# Patient Record
Sex: Male | Born: 1945 | Race: White | Hispanic: No | Marital: Married
Health system: Midwestern US, Academic
[De-identification: ages and names within clinical notes are randomized; demographics above are authoritative.]

---

## 2016-10-28 IMAGING — US DUPLEX LOW EXTREM ARTERY BIL
1 series · 14 of 25 positions shown · non-contrast
Comparison: none

[Series 1: duplex low extrem artery bil · 14 of 65 slices shown]
[im 1/65]
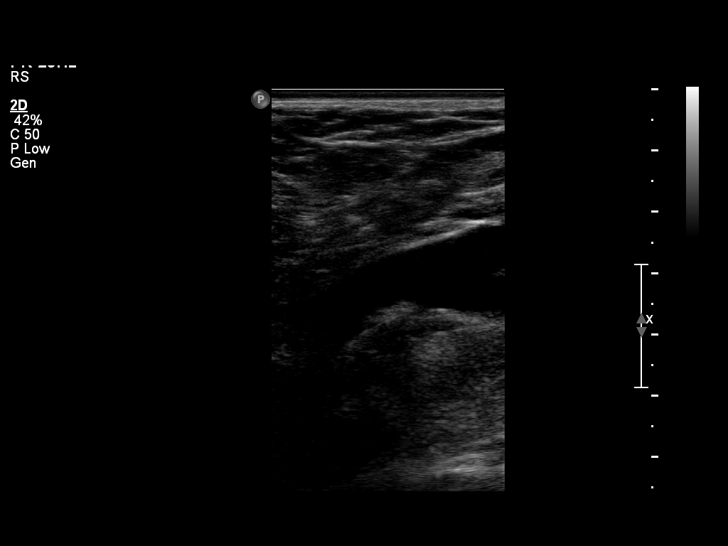
[im 6/65]
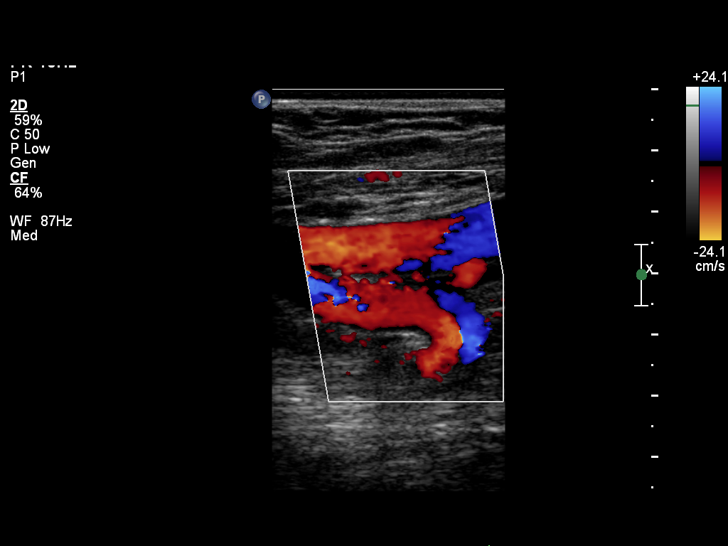
[im 11/65]
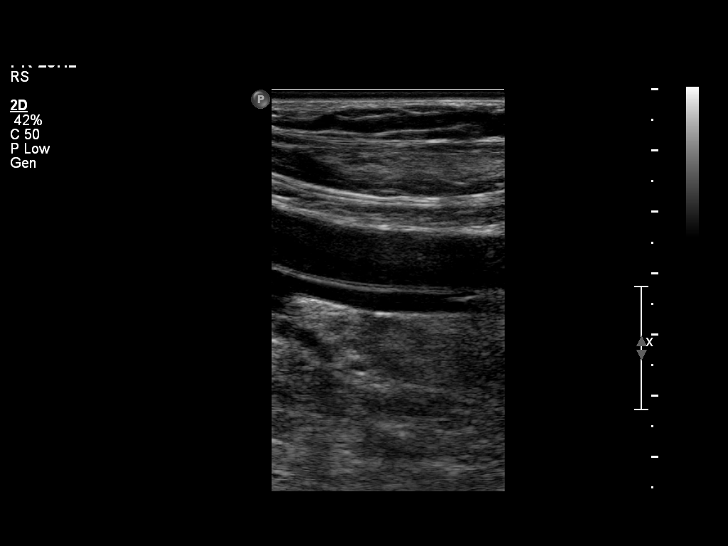
[im 17/65]
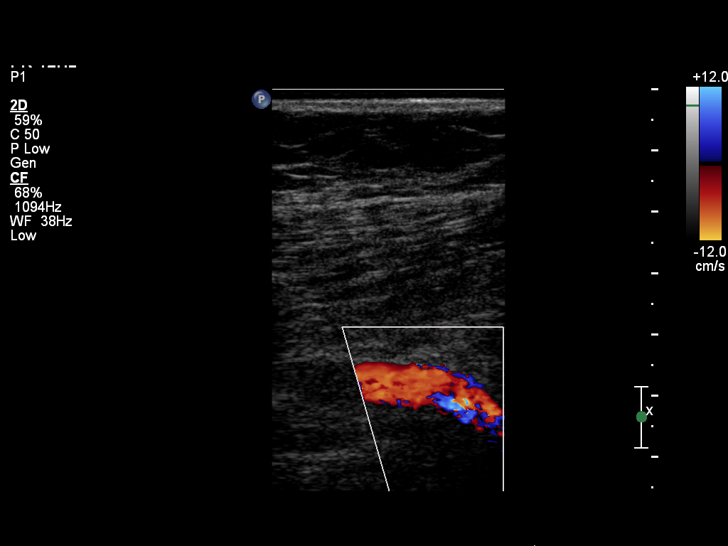
[im 22/65]
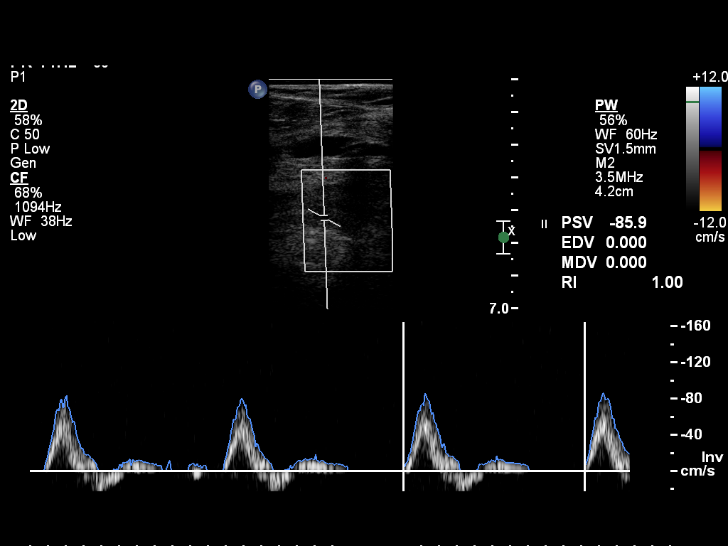
[im 25/65]
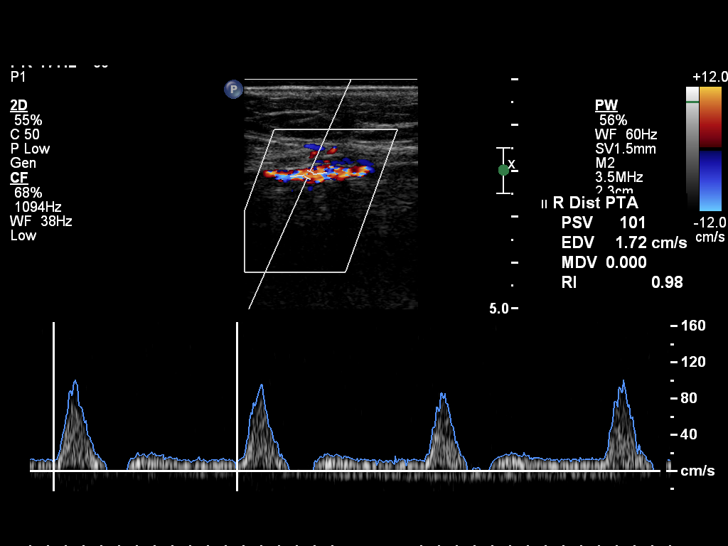
[im 30/65]
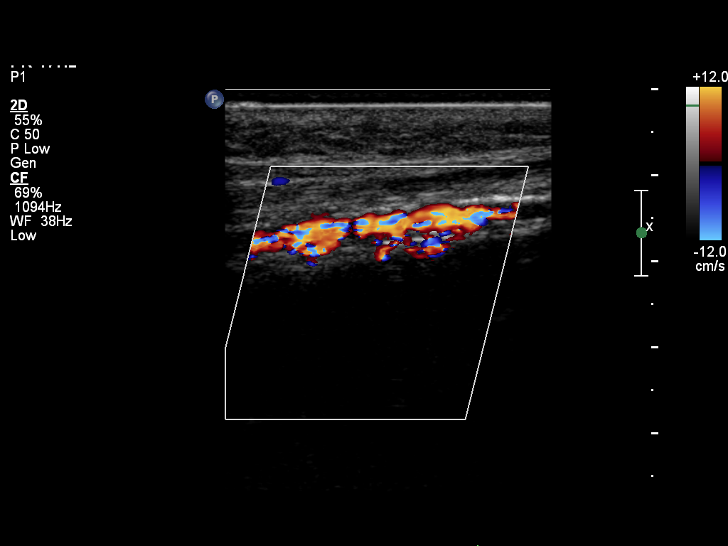
[im 35/65]
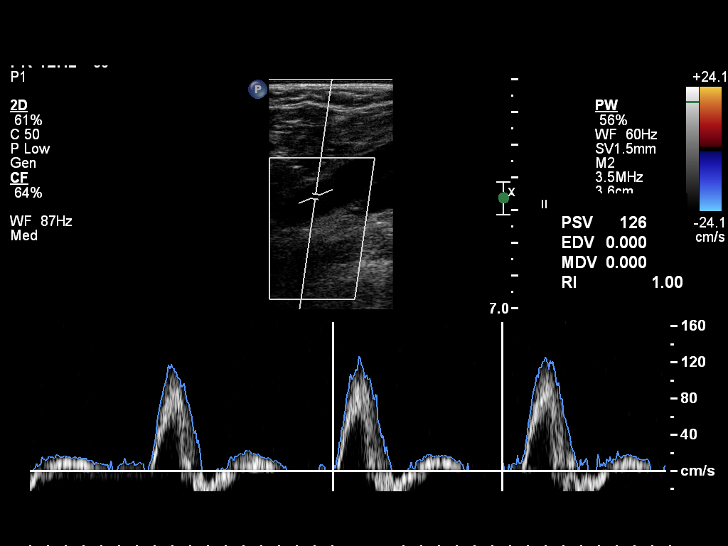
[im 41/65]
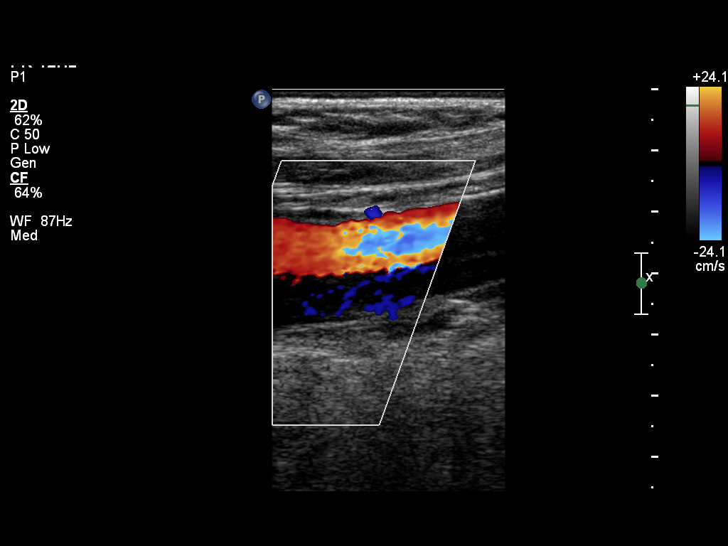
[im 43/65]
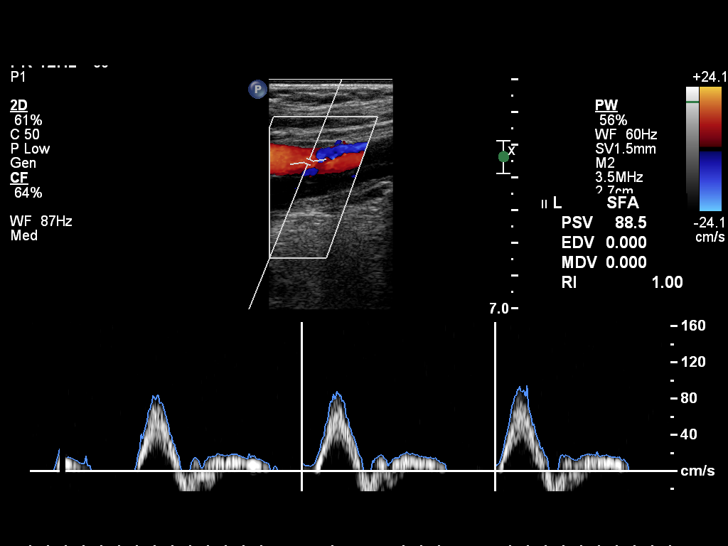
[im 49/65]
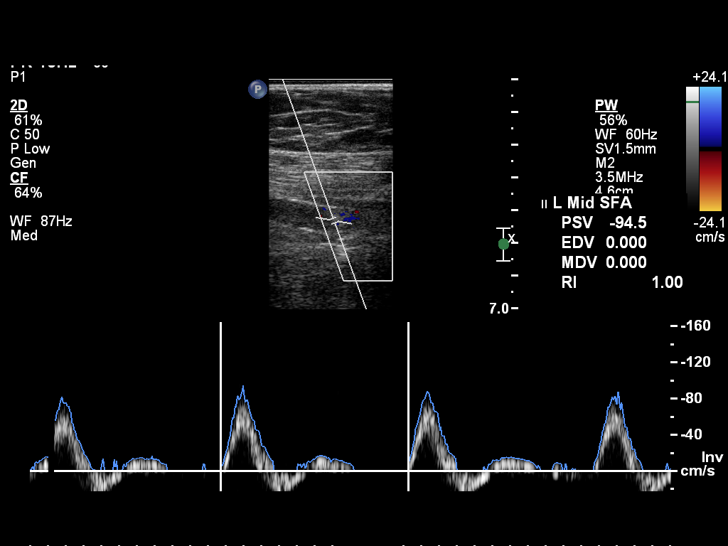
[im 54/65]
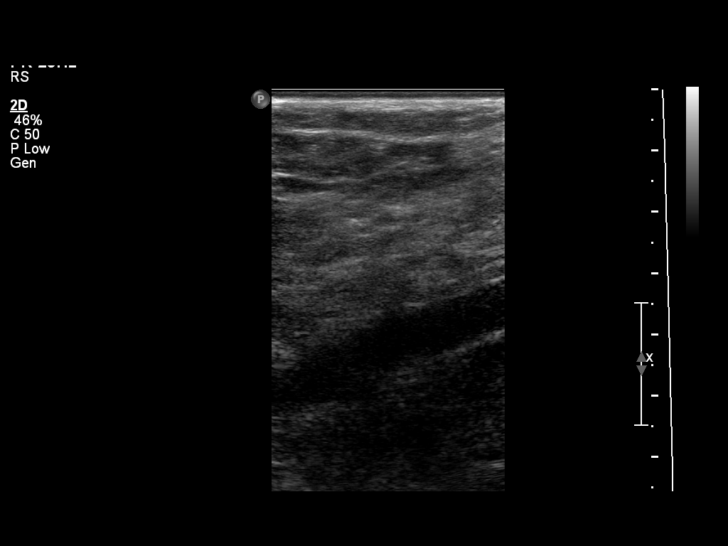
[im 59/65]
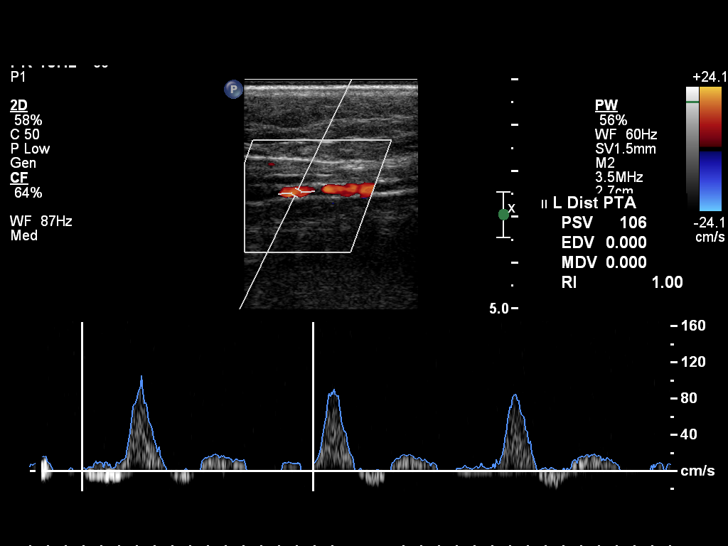
[im 65/65]
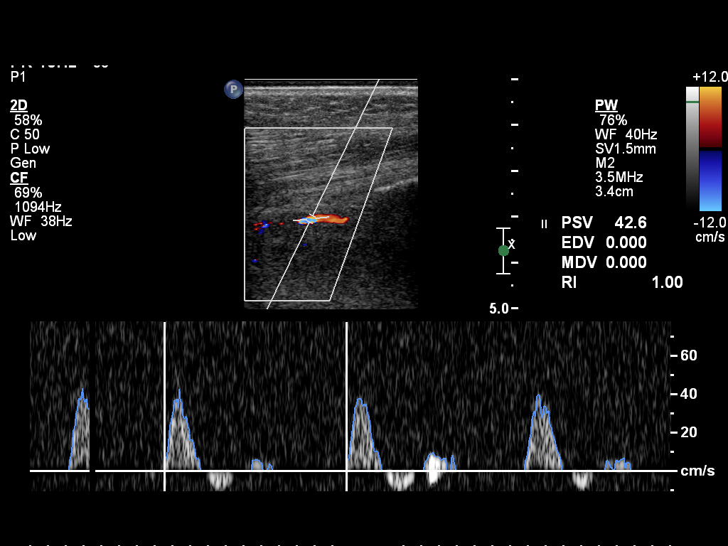

[14 of 25 positions shown; findings below may reference images not displayed]

ULTRASOUND REPORT

DIAGNOSTIC STUDIES

EXAM
Lower extremity artery ultrasound.

INDICATION
CLAUDICATION, BILAT FOOT PAIN

TECHNIQUE
High-resolution grayscale, color Doppler, and duplex interrogation of bilateral lower extremity
arteries. Ankle-brachial indices also performed.

COMPARISONS
None.

FINDINGS
Preservation of triphasic waveforms are appreciated diffusely. The peak systolic velocity
measurements are also well maintained diffusely without evidence of focal occlusion or stenosis.
The ankle-brachial index is normal bilaterally measuring 1.6 on the right and 1.6 on the left.

IMPRESSION
No ultrasonographic evidence of significant atherosclerotic disease.

## 2016-12-07 MED ORDER — LACTATED RINGERS IV SOLP
1000 mL | INTRAVENOUS | 0 refills | Status: CP
Start: 2016-12-07 — End: ?

## 2016-12-07 MED ORDER — CARVEDILOL 12.5 MG PO TAB
12.5 mg | Freq: Two times a day (BID) | ORAL | 0 refills | Status: DC
Start: 2016-12-07 — End: 2016-12-09

## 2016-12-07 MED ORDER — ROPINIROLE 0.25 MG PO TAB
.5 mg | Freq: Three times a day (TID) | ORAL | 0 refills | Status: DC
Start: 2016-12-07 — End: 2016-12-09

## 2016-12-07 MED ORDER — ACETAMINOPHEN 325 MG PO TAB
650 mg | ORAL | 0 refills | Status: DC | PRN
Start: 2016-12-07 — End: 2016-12-16

## 2016-12-07 MED ORDER — CARBIDOPA-LEVODOPA 25-100 MG PO TAB
1 | Freq: Four times a day (QID) | ORAL | 0 refills | Status: CN
Start: 2016-12-07 — End: ?

## 2016-12-07 MED ORDER — AMLODIPINE 5 MG PO TAB
5 mg | Freq: Every day | ORAL | 0 refills | Status: CN
Start: 2016-12-07 — End: ?

## 2016-12-07 MED ORDER — DOFETILIDE 500 MCG PO CAP
500 ug | Freq: Two times a day (BID) | ORAL | 0 refills | Status: DC
Start: 2016-12-07 — End: 2016-12-16

## 2016-12-07 MED ORDER — MAGNESIUM OXIDE 400 MG PO TAB
400 mg | Freq: Every day | ORAL | 0 refills | Status: CN
Start: 2016-12-07 — End: ?

## 2016-12-07 MED ORDER — MAGNESIUM OXIDE 400 MG PO TAB
400 mg | Freq: Every day | ORAL | 0 refills | Status: DC
Start: 2016-12-07 — End: 2016-12-11

## 2016-12-07 MED ORDER — DOCUSATE SODIUM 100 MG PO CAP
100 mg | Freq: Every day | ORAL | 0 refills | Status: CN | PRN
Start: 2016-12-07 — End: ?

## 2016-12-07 MED ORDER — RIVAROXABAN 20 MG PO TAB
20 mg | Freq: Every day | ORAL | 0 refills | Status: DC
Start: 2016-12-07 — End: 2016-12-16

## 2016-12-07 MED ORDER — CARBIDOPA-LEVODOPA 50-200 MG PO TBER
1 | Freq: Two times a day (BID) | ORAL | 0 refills | Status: DC
Start: 2016-12-07 — End: 2016-12-08

## 2016-12-07 MED ORDER — CARBIDOPA-LEVODOPA 25-250 MG PO TAB
1 | Freq: Four times a day (QID) | ORAL | 0 refills | Status: AC
Start: 2016-12-07 — End: ?

## 2016-12-07 MED ORDER — TRAZODONE 50 MG PO TAB
50 mg | ORAL | 0 refills | Status: DC | PRN
Start: 2016-12-07 — End: 2016-12-16

## 2016-12-07 MED ORDER — SODIUM CHLORIDE 0.9 % IV SOLP
INTRAVENOUS | 0 refills | Status: DC
Start: 2016-12-07 — End: 2016-12-09

## 2016-12-07 MED ORDER — SENNOSIDES 8.6 MG PO TAB
1 | Freq: Two times a day (BID) | ORAL | 0 refills | Status: DC
Start: 2016-12-07 — End: 2016-12-16

## 2016-12-08 MED ORDER — GADOBENATE DIMEGLUMINE 529 MG/ML (0.1MMOL/0.2ML) IV SOLN
20 mL | Freq: Once | INTRAVENOUS | 0 refills | Status: CP
Start: 2016-12-08 — End: ?

## 2016-12-08 MED ORDER — CARBIDOPA-LEVODOPA 48.75-195 MG PO CPER
3 | Freq: Three times a day (TID) | ORAL | 0 refills | Status: DC
Start: 2016-12-08 — End: 2016-12-09

## 2016-12-08 MED ORDER — CAMPHOR-MENTHOL 0.5-0.5 % TP LOTN
Freq: Three times a day (TID) | TOPICAL | 0 refills | Status: DC | PRN
Start: 2016-12-08 — End: 2016-12-16

## 2016-12-09 MED ORDER — MELATONIN 5 MG PO TAB
5 mg | Freq: Every evening | ORAL | 0 refills | Status: DC
Start: 2016-12-09 — End: 2016-12-16

## 2016-12-09 MED ORDER — ROPINIROLE 0.25 MG PO TAB
.25 mg | Freq: Three times a day (TID) | ORAL | 0 refills | Status: DC
Start: 2016-12-09 — End: 2016-12-09

## 2016-12-09 MED ORDER — CARBIDOPA-LEVODOPA 48.75-195 MG PO CPER
3 | Freq: Three times a day (TID) | ORAL | 0 refills | Status: DC
Start: 2016-12-09 — End: 2016-12-16

## 2016-12-09 MED ORDER — LACTATED RINGERS IV SOLP
1000 mL | INTRAVENOUS | 0 refills | Status: CP
Start: 2016-12-09 — End: ?

## 2016-12-10 MED ORDER — MAGNESIUM SULFATE IN D5W 1 GRAM/100 ML IV PGBK
1 g | Freq: Once | INTRAVENOUS | 0 refills | Status: CP
Start: 2016-12-10 — End: ?

## 2016-12-10 MED ORDER — POTASSIUM CHLORIDE 20 MEQ PO TBTQ
40 meq | Freq: Once | ORAL | 0 refills | Status: CP
Start: 2016-12-10 — End: ?

## 2016-12-10 MED ORDER — METHYLPHENIDATE HCL 5 MG PO TAB
5 mg | Freq: Every day | ORAL | 0 refills | Status: DC
Start: 2016-12-10 — End: 2016-12-16

## 2016-12-10 MED ORDER — TRAZODONE 50 MG PO TAB
50 mg | Freq: Once | ORAL | 0 refills | Status: CP
Start: 2016-12-10 — End: ?

## 2016-12-11 MED ORDER — MAGNESIUM SULFATE IN D5W 1 GRAM/100 ML IV PGBK
1 g | Freq: Once | INTRAVENOUS | 0 refills | Status: CP
Start: 2016-12-11 — End: ?

## 2016-12-11 MED ORDER — LACTATED RINGERS IV SOLP
1000 mL | INTRAVENOUS | 0 refills | Status: CP
Start: 2016-12-11 — End: ?

## 2016-12-11 MED ORDER — MAGNESIUM OXIDE 400 MG PO TAB
400 mg | Freq: Two times a day (BID) | ORAL | 0 refills | Status: DC
Start: 2016-12-11 — End: 2016-12-16

## 2016-12-11 MED ORDER — POTASSIUM CHLORIDE 20 MEQ PO TBTQ
40 meq | Freq: Once | ORAL | 0 refills | Status: CP
Start: 2016-12-11 — End: ?

## 2016-12-11 MED ORDER — BISACODYL 10 MG RE SUPP
10 mg | Freq: Once | RECTAL | 0 refills | Status: CP
Start: 2016-12-11 — End: ?

## 2016-12-11 MED ORDER — POTASSIUM CHLORIDE 20 MEQ PO TBTQ
20 meq | Freq: Once | ORAL | 0 refills | Status: CP
Start: 2016-12-11 — End: ?

## 2016-12-12 MED ORDER — MIDODRINE 5 MG PO TAB
2.5 mg | Freq: Two times a day (BID) | ORAL | 0 refills | Status: DC
Start: 2016-12-12 — End: 2016-12-16

## 2016-12-14 MED ORDER — LACTATED RINGERS IV SOLP
1000 mL | INTRAVENOUS | 0 refills | Status: DC
Start: 2016-12-14 — End: 2016-12-16

## 2016-12-14 MED ORDER — BISACODYL 10 MG RE SUPP
10 mg | Freq: Once | RECTAL | 0 refills | Status: CP
Start: 2016-12-14 — End: ?

## 2016-12-14 MED ORDER — POTASSIUM CHLORIDE 20 MEQ PO TBTQ
40 meq | Freq: Once | ORAL | 0 refills | Status: CP
Start: 2016-12-14 — End: ?

## 2016-12-16 MED ORDER — TRAZODONE 50 MG PO TAB
50 mg | ORAL_TABLET | ORAL | 3 refills | Status: DC | PRN
Start: 2016-12-16 — End: 2016-12-30

## 2016-12-16 MED ORDER — ACETAMINOPHEN 325 MG PO TAB
650 mg | ORAL | 0 refills | Status: DC | PRN
Start: 2016-12-16 — End: 2016-12-31

## 2016-12-16 MED ORDER — BISACODYL 10 MG RE SUPP
10 mg | Freq: Every day | RECTAL | 0 refills | Status: DC
Start: 2016-12-16 — End: 2016-12-16

## 2016-12-16 MED ORDER — METHYLPHENIDATE HCL 5 MG PO TAB
5 mg | Freq: Two times a day (BID) | ORAL | 0 refills | Status: DC
Start: 2016-12-16 — End: 2016-12-27

## 2016-12-16 MED ORDER — METHYLPHENIDATE HCL 5 MG PO TAB
Freq: Every day | TRANSDERMAL | 0 refills | 30.00000 days | Status: DC
Start: 2016-12-16 — End: 2016-12-30

## 2016-12-16 MED ORDER — SENNOSIDES 8.6 MG PO TAB
2 | Freq: Every evening | ORAL | 0 refills | Status: DC
Start: 2016-12-16 — End: 2016-12-31

## 2016-12-16 MED ORDER — RIVAROXABAN 20 MG PO TAB
20 mg | Freq: Every day | ORAL | 0 refills | Status: DC
Start: 2016-12-16 — End: 2016-12-31

## 2016-12-16 MED ORDER — CARBIDOPA-LEVODOPA 48.75-195 MG PO CPER
3 | Freq: Three times a day (TID) | ORAL | 0 refills | Status: DC
Start: 2016-12-16 — End: 2016-12-31

## 2016-12-16 MED ORDER — DOCUSATE SODIUM 100 MG PO CAP
100 mg | Freq: Two times a day (BID) | ORAL | 0 refills | Status: DC
Start: 2016-12-16 — End: 2016-12-21

## 2016-12-16 MED ORDER — METHYLPHENIDATE HCL 5 MG PO TAB
5 mg | Freq: Two times a day (BID) | ORAL | 0 refills | Status: DC
Start: 2016-12-16 — End: 2016-12-16

## 2016-12-16 MED ORDER — DOFETILIDE 500 MCG PO CAP
500 ug | Freq: Two times a day (BID) | ORAL | 0 refills | Status: DC
Start: 2016-12-16 — End: 2016-12-31

## 2016-12-16 MED ORDER — BISACODYL 10 MG RE SUPP
10 mg | Freq: Every day | RECTAL | 0 refills | 1.00000 days | Status: DC
Start: 2016-12-16 — End: 2016-12-30

## 2016-12-16 MED ORDER — HELP MEDICATION
Freq: Three times a day (TID) | 0 refills | Status: DC
Start: 2016-12-16 — End: 2016-12-16

## 2016-12-16 MED ORDER — ALUMINUM-MAGNESIUM HYDROXIDE 200-200 MG/5 ML PO SUSP
30 mL | ORAL | 0 refills | Status: DC | PRN
Start: 2016-12-16 — End: 2016-12-31

## 2016-12-16 MED ORDER — MELATONIN 5 MG PO TAB
5 mg | Freq: Every evening | ORAL | 0 refills | 28.00000 days | Status: DC
Start: 2016-12-16 — End: 2016-12-30

## 2016-12-16 MED ORDER — CARBIDOPA-LEVODOPA 48.75-195 MG PO CPER
3 | ORAL_CAPSULE | Freq: Three times a day (TID) | ORAL | 1 refills | Status: DC
Start: 2016-12-16 — End: 2017-02-20

## 2016-12-16 MED ORDER — TRAZODONE 50 MG PO TAB
50 mg | ORAL | 0 refills | Status: DC | PRN
Start: 2016-12-16 — End: 2016-12-17

## 2016-12-16 MED ORDER — MIDODRINE 2.5 MG PO TAB
2.5 mg | Freq: Two times a day (BID) | ORAL | 0 refills | Status: DC
Start: 2016-12-16 — End: 2016-12-30

## 2016-12-16 MED ORDER — MULTIVIT-IRON-FA-CALCIUM-MINS 9 MG IRON-400 MCG PO TAB
1 | Freq: Every day | ORAL | 0 refills | Status: DC
Start: 2016-12-16 — End: 2016-12-31

## 2016-12-16 MED ORDER — BISACODYL 10 MG RE SUPP
10 mg | Freq: Every day | RECTAL | 0 refills | Status: DC | PRN
Start: 2016-12-16 — End: 2016-12-31

## 2016-12-16 MED ORDER — MIDODRINE 5 MG PO TAB
2.5 mg | Freq: Two times a day (BID) | ORAL | 0 refills | Status: DC
Start: 2016-12-16 — End: 2016-12-18

## 2016-12-16 MED ORDER — MAGNESIUM OXIDE 400 MG PO TAB
400 mg | Freq: Two times a day (BID) | ORAL | 0 refills | Status: DC
Start: 2016-12-16 — End: 2016-12-31

## 2016-12-16 MED ORDER — MAGNESIUM HYDROXIDE 2,400 MG/10 ML PO SUSP
10 mL | ORAL | 0 refills | Status: DC | PRN
Start: 2016-12-16 — End: 2016-12-31

## 2016-12-16 MED ORDER — MELATONIN 5 MG PO TAB
5 mg | Freq: Every evening | ORAL | 0 refills | Status: DC
Start: 2016-12-16 — End: 2016-12-29

## 2016-12-17 MED ORDER — SODIUM CHLORIDE 0.9 % FLUSH
3-5 mL | Freq: Three times a day (TID) | INTRAVENOUS | 0 refills | Status: DC
Start: 2016-12-17 — End: 2016-12-17

## 2016-12-17 MED ORDER — TRAZODONE 50 MG PO TAB
50 mg | Freq: Every evening | ORAL | 0 refills | Status: DC
Start: 2016-12-17 — End: 2016-12-29

## 2016-12-18 MED ORDER — AMLODIPINE 5 MG PO TAB
2.5 mg | Freq: Every day | ORAL | 0 refills | Status: DC
Start: 2016-12-18 — End: 2016-12-18

## 2016-12-21 MED ORDER — DOCUSATE SODIUM 100 MG PO CAP
100 mg | Freq: Two times a day (BID) | ORAL | 0 refills | Status: DC | PRN
Start: 2016-12-21 — End: 2016-12-26

## 2016-12-21 MED ORDER — CARVEDILOL 3.125 MG PO TAB
3.125 mg | Freq: Two times a day (BID) | ORAL | 0 refills | Status: DC
Start: 2016-12-21 — End: 2016-12-23

## 2016-12-23 MED ORDER — CARVEDILOL 6.25 MG PO TAB
6.25 mg | Freq: Two times a day (BID) | ORAL | 0 refills | Status: DC
Start: 2016-12-23 — End: 2016-12-26

## 2016-12-24 MED ORDER — POTASSIUM CHLORIDE 20 MEQ PO TBTQ
40 meq | Freq: Once | ORAL | 0 refills | Status: CP
Start: 2016-12-24 — End: ?

## 2016-12-25 MED ORDER — LIDOCAINE HCL 2 % MM JELL
TOPICAL | 0 refills | Status: DC | PRN
Start: 2016-12-25 — End: 2016-12-31

## 2016-12-26 MED ORDER — CARVEDILOL 12.5 MG PO TAB
12.5 mg | Freq: Two times a day (BID) | ORAL | 0 refills | Status: DC
Start: 2016-12-26 — End: 2016-12-31

## 2016-12-26 MED ORDER — IMS MIXTURE TEMPLATE
30 meq | Freq: Two times a day (BID) | ORAL | 0 refills | Status: CP
Start: 2016-12-26 — End: ?

## 2016-12-26 MED ORDER — DOCUSATE SODIUM 100 MG PO CAP
100 mg | Freq: Every day | ORAL | 0 refills | Status: DC
Start: 2016-12-26 — End: 2016-12-31

## 2016-12-27 MED ORDER — QUETIAPINE 25 MG PO TAB
25 mg | Freq: Every evening | ORAL | 0 refills | Status: DC
Start: 2016-12-27 — End: 2016-12-29

## 2016-12-27 MED ORDER — QUETIAPINE 25 MG PO TAB
25 mg | Freq: Once | ORAL | 0 refills | Status: DC
Start: 2016-12-27 — End: 2016-12-27

## 2016-12-27 MED ORDER — QUETIAPINE 25 MG PO TAB
12.5 mg | Freq: Two times a day (BID) | ORAL | 0 refills | Status: DC
Start: 2016-12-27 — End: 2016-12-31

## 2016-12-28 MED ORDER — METHYLPHENIDATE HCL 5 MG PO TAB
5 mg | Freq: Two times a day (BID) | ORAL | 0 refills | Status: DC
Start: 2016-12-28 — End: 2016-12-29

## 2016-12-29 MED ORDER — QUETIAPINE 25 MG PO TAB
50 mg | Freq: Every evening | ORAL | 0 refills | Status: DC
Start: 2016-12-29 — End: 2016-12-29

## 2016-12-29 MED ORDER — POTASSIUM CHLORIDE 20 MEQ PO TBTQ
40 meq | Freq: Once | ORAL | 0 refills | Status: CP
Start: 2016-12-29 — End: ?

## 2016-12-29 MED ORDER — MELATONIN 5 MG PO TAB
10 mg | Freq: Every evening | ORAL | 0 refills | Status: DC
Start: 2016-12-29 — End: 2016-12-31

## 2016-12-29 MED ORDER — METHYLPHENIDATE HCL 5 MG PO TAB
ORAL_TABLET | Freq: Every day | 0 refills | Status: CN
Start: 2016-12-29 — End: ?

## 2016-12-29 MED ORDER — QUETIAPINE 25 MG PO TAB
25 mg | Freq: Every evening | ORAL | 0 refills | Status: DC
Start: 2016-12-29 — End: 2016-12-31

## 2016-12-30 MED ORDER — CARVEDILOL 12.5 MG PO TAB
12.5 mg | ORAL_TABLET | Freq: Two times a day (BID) | ORAL | 1 refills | 90.00000 days | Status: AC
Start: 2016-12-30 — End: ?

## 2016-12-30 MED ORDER — LIDOCAINE HCL 2 % MM JELL
TOPICAL | 1 refills | 30.00000 days | Status: DC | PRN
Start: 2016-12-30 — End: 2017-08-17

## 2016-12-30 MED ORDER — POTASSIUM CHLORIDE 20 MEQ PO TBTQ
20 meq | Freq: Every day | ORAL | 0 refills | Status: DC
Start: 2016-12-30 — End: 2016-12-31

## 2016-12-30 MED ORDER — POTASSIUM CHLORIDE 20 MEQ PO TBTQ
20 meq | ORAL_TABLET | Freq: Every day | ORAL | 3 refills | 30.00000 days | Status: AC
Start: 2016-12-30 — End: ?

## 2016-12-30 MED ORDER — POTASSIUM CHLORIDE 20 MEQ PO TBTQ
40 meq | Freq: Once | ORAL | 0 refills | Status: CP
Start: 2016-12-30 — End: ?

## 2016-12-30 MED ORDER — QUETIAPINE 25 MG PO TAB
ORAL_TABLET | 2 refills | Status: DC
Start: 2016-12-30 — End: 2017-02-20

## 2016-12-30 MED ORDER — MELATONIN 5 MG PO TAB
10 mg | ORAL_TABLET | Freq: Every evening | ORAL | 2 refills | 28.00000 days | Status: DC
Start: 2016-12-30 — End: 2017-08-17

## 2017-01-03 MED ORDER — DOFETILIDE 500 MCG PO CAP
500 ug | ORAL_CAPSULE | Freq: Two times a day (BID) | ORAL | 0 refills | Status: DC
Start: 2017-01-03 — End: 2017-08-02

## 2017-02-09 ENCOUNTER — Ambulatory Visit: Admit: 2017-02-09 | Discharge: 2017-02-10 | Payer: MEDICARE

## 2017-02-09 DIAGNOSIS — R4189 Other symptoms and signs involving cognitive functions and awareness: ICD-10-CM

## 2017-02-09 DIAGNOSIS — G9340 Encephalopathy, unspecified: Principal | ICD-10-CM

## 2017-02-09 DIAGNOSIS — F4321 Adjustment disorder with depressed mood: ICD-10-CM

## 2017-02-09 LAB — COMPREHENSIVE METABOLIC PANEL
Lab: 0.9
Lab: 139 — ABNORMAL HIGH (ref 83–110)

## 2017-02-09 NOTE — Progress Notes
Patient completed neuropsychological testing.    02/09/2017  10:23am - 12:40pm  Neuropsych Testing  2:00pm - 2:17pm  Scoring

## 2017-02-13 ENCOUNTER — Encounter: Admit: 2017-02-13 | Discharge: 2017-02-13 | Payer: MEDICARE

## 2017-02-13 NOTE — Telephone Encounter
Initial Visit Date: 02/20/17    Reason for Visit: parkinson  Currently residing [facility].     Is this as a result of an injury? No    Physician Information  PCP: Dr. Erskine Emeryhomas [place] in [place]  Referring Physician: Mellody DanceMonica Kurylo, PhD, ABPP at [facility]  Previous Neurologist: Dr. Serafina RoyalsEwing at [facility]  Other providers seen: Dr. Danella MaiersSteven Owens [place] [facility], [facility]    Previous Imaging/Procedures:  MRI head- 12/08/16 [place]  CT head- [facility] 12/05/16 (OR); 12/07/16 [place]; 07/17/14 [facility] ([facility])  XRay none  EMG none  EEG none    ED/Hospitalizations:  [facility] admission- 12/07/16 acute encephalopathy- transferred to [facility] 12/16/16;   [facility] admission 11/21/16 weakness- transferred to inpt rehab 11/25/16  [facility] ER visit 7/5-01/2017 confusion, muscle weakness    Current Meds:  Rytary     Medications, Allergies, History Updated    DPOA- [place] [phone]

## 2017-02-14 ENCOUNTER — Encounter: Admit: 2017-02-14 | Discharge: 2017-02-14 | Payer: MEDICARE

## 2017-02-14 DIAGNOSIS — R69 Illness, unspecified: Principal | ICD-10-CM

## 2017-02-15 ENCOUNTER — Encounter: Admit: 2017-02-15 | Discharge: 2017-02-15 | Payer: MEDICARE

## 2017-02-15 DIAGNOSIS — I2699 Other pulmonary embolism without acute cor pulmonale: ICD-10-CM

## 2017-02-15 DIAGNOSIS — F0390 Unspecified dementia without behavioral disturbance: ICD-10-CM

## 2017-02-15 DIAGNOSIS — G250 Essential tremor: ICD-10-CM

## 2017-02-15 DIAGNOSIS — G4733 Obstructive sleep apnea (adult) (pediatric): ICD-10-CM

## 2017-02-15 DIAGNOSIS — I4891 Unspecified atrial fibrillation: ICD-10-CM

## 2017-02-15 DIAGNOSIS — G20 Parkinson's disease: ICD-10-CM

## 2017-02-15 DIAGNOSIS — I10 Essential (primary) hypertension: Principal | ICD-10-CM

## 2017-02-17 ENCOUNTER — Encounter: Admit: 2017-02-17 | Discharge: 2017-02-17 | Payer: MEDICARE

## 2017-02-20 ENCOUNTER — Ambulatory Visit: Admit: 2017-02-20 | Discharge: 2017-02-21 | Payer: MEDICARE

## 2017-02-20 ENCOUNTER — Encounter: Admit: 2017-02-20 | Discharge: 2017-02-20 | Payer: MEDICARE

## 2017-02-20 DIAGNOSIS — G250 Essential tremor: ICD-10-CM

## 2017-02-20 DIAGNOSIS — I2699 Other pulmonary embolism without acute cor pulmonale: ICD-10-CM

## 2017-02-20 DIAGNOSIS — G20 Parkinson's disease: ICD-10-CM

## 2017-02-20 DIAGNOSIS — I10 Essential (primary) hypertension: Principal | ICD-10-CM

## 2017-02-20 DIAGNOSIS — F0390 Unspecified dementia without behavioral disturbance: ICD-10-CM

## 2017-02-20 DIAGNOSIS — G4733 Obstructive sleep apnea (adult) (pediatric): ICD-10-CM

## 2017-02-20 DIAGNOSIS — I4891 Unspecified atrial fibrillation: ICD-10-CM

## 2017-02-20 MED ORDER — QUETIAPINE 50 MG PO TAB
50 mg | ORAL_TABLET | Freq: Every evening | ORAL | 3 refills | Status: AC
Start: 2017-02-20 — End: 2017-07-04

## 2017-02-20 MED ORDER — CARBIDOPA-LEVODOPA 48.75-195 MG PO CPER
3 | ORAL_CAPSULE | Freq: Four times a day (QID) | ORAL | 5 refills | Status: AC
Start: 2017-02-20 — End: 2017-03-20

## 2017-02-20 NOTE — Progress Notes
Date of Service: 02/20/2017    Subjective:             Alejandro Mitchell is a 71 y.o. male with Parkinson disease.     History of Present Illness  This is a 71 year old right-handed gentleman who has tremor since the age of 16 years. He was diagnosed with essential tremor but the tremor was not bothersome. The tremor started to get worse in his 74s but never interfered with his ADLs. His wife noticed a change in his walking around 2011 and he was slow while walking. He was seen by a neurologist who started him on Sinemet which helped his symptoms. His wife states that she has seen a boost with this medication but over time this medicine has stopped working. He was using a walker one year ago but admitted in April due to recurrent falls. He was released to a rehab facility but admitted in May because of behavioral issues. Currently, he lives in a nursing home and has a very difficult time while walking and doing his ADLs. He complained of REM behavior disorder 5 years ago but this has resolved on his own. The cognitive issues are getting worse and there is a clear interference with iADLs such as driving, finances, and cooking. He is complaining of visual hallucinations and is seeing a person and occasionally rabbits. He is unable to get a full night of sleep and has daytime sleepiness as well. His walking is worse to an extent that he is freezing while starting to walk and in the doorways.       Part I: Non-Motor    1.1 Cognitive Impairment    3    1.2 Hallucinations and Psychosis   2    1.3 Depressed Mood  1    1.4 Anxious Mood  2    1.5 Apathy  1    1.6 Dopamine Dysregulation Syndrome   1      Part I: Non-Motor Aspects of Experiences of Daily Living (nM-EDL)    1.7 Sleep Problems   3    1.8 Daytime Sleepiness  3    1.9 Pain and other Sensations  2    1.10 Urinary Problems   4    1.11 Constipation   1    1.12 Lightheadedness on Standing   0    1.13 Fatigue  1 Part II: Motor Aspects of Experiences of Daily Living    2.1 Speech  3    2.2 Saliva and Drooling    1    2.3 Chewing and Swallowing    1    2.4 Eating Tasks   2    2.5 Dressing   4    2.6 Hygiene  4    2.7 Handwriting  3    2.8 Doing Hobbies and Other Activities   4    2.9 Turning in Bed  1    2.10 Tremor   3    2.11 Getting out of bed, a car, or a deep chair   4    2.12 Walking and Balance   4    2.13 Freezing   4       Review of Systems   Psychiatric/Behavioral: Positive for confusion and hallucinations.   All other systems reviewed and are negative.    Allergies   Allergen Reactions   ??? Adhesive RASH     From EKG electrodes when left on a long time.    ??? Sulfa (Sulfonamide Antibiotics) RASH  Past Medical History:   Diagnosis Date   ??? Atrial fibrillation (HCC) 02/20/2009   ??? Dementia    ??? Essential tremor 08/08/1962   ??? Hypertension 02/20/2009   ??? Obstructive sleep apnea 02/20/2009   ??? Parkinson disease (HCC)    ??? Pulmonary embolism (HCC) 09/2012     Past Surgical History:   Procedure Laterality Date   ??? ABLATION OF DYSRHYTHMIC FOCUS  06-01-10    LAA RFA   ??? ELECTIVE CARDIOVERSION  05/18/2011        ??? CATARACT REMOVAL WITH IMPLANT  02/2014   ??? CARDIOVERSION     ??? KNEE ARTHROPLASTY Right    ??? WISDOM TEETH EXTRACTION       Family History   Problem Relation Age of Onset   ??? Sudden Cardiac Death Father 60   ??? Heart Attack Father    ??? Stroke Mother 26     Social History     Social History   ??? Marital status: Married     Spouse name: Alejandro Mitchell   ??? Number of children: 4   ??? Years of education: N/A     Occupational History   ??? Physical Education Teacher      Social History Main Topics   ??? Smoking status: Never Smoker   ??? Smokeless tobacco: Never Used   ??? Alcohol use 0.6 oz/week     1 Cans of beer per week      Comment: 1 oz a day   ??? Drug use: No   ??? Sexual activity: Not on file     Other Topics Concern   ??? Military Service Yes     Social History Narrative   ??? No narrative on file       Objective: ??? acetaminophen (TYLENOL) 325 mg tablet Take 650 mg by mouth every 4 hours as needed for Pain.   ??? carbidopa-levodopa ER (RYTARY) 48.75-195 mg Take 3 capsules by mouth three times daily. Take @ 0800/1200/1600   ??? carvedilol (COREG) 12.5 mg tablet Take 1 tablet by mouth twice daily. Take with food.   ??? dofetilide (TIKOSYN) 500 mcg capsule Take 1 capsule by mouth twice daily.   ??? lidocaine (XYLOCAINE) 2 % topical gel Apply to ingrown toenail as needed.   ??? magnesium oxide (MAG-OX) 400 mg tablet Take 1 Tab by mouth daily.   ??? melatonin 5 mg tab Take 2 tablets by mouth at bedtime daily.   ??? potassium chloride SR (K-DUR) 20 mEq tablet Take 1 tablet by mouth daily with breakfast. Take with a meal and a full glass of water.   ??? psyllium husk (METAMUCIL PO) Take  by mouth.   ??? QUEtiapine (SEROQUEL) 25 mg tablet Take 1/2 tablet every morning (9AM) and afternoon (3PM), and 1 tablet (9PM)   ??? traMADol (ULTRAM) 50 mg tablet Take 50-100 mg by mouth every 6 hours as needed.   ??? traZODone (DESYREL) 50 mg tablet Take 50 mg by mouth at bedtime as needed.   ??? vitamins, multi w/minerals tablet Take 1 tablet by mouth daily.     Vitals:    02/20/17 1106   BP: 152/70   Pulse: 59   SpO2: 100%   Weight: 106.6 kg (235 lb)   Height: 182.9 cm (72)     Body mass index is 31.87 kg/m???.     Physical Exam    General examination: healthy male in no acute distress. Head was normocephalic and atraumatic, conjunctiva were clear, mucous membranes were moist.  Neck was supple.     Neurologic examination:Speech was fluent. Cranial nerve exam showed full visual fields, pupils were equal round and reactive to light and accommodation, extra-ocular movements were full without square wave jerks or nystagmus. Saccades were slowed. Facial sensation and strength were intact, tongue was midline and mildly bradykinetic, and palate elevated symmetrically. Hearing was intact to conversation. Shoulder shrug was decreased. Motor exam showed 5/5 strength throughout with normal muscle bulk. Coordination showed no dysmetria on finger-nose-finger testing.    Movement disorders examination: hypophonic speech and mildly decreased facial expressions. There was palmomental reflexes. There was mild tremor at rest in the bilateral upper extremities. There was mild symmetric action, postural, and intention tremor in the bilateral upper extremities. Tone was increased in the legs. Finger tapping, hand grips, rapid alternating movements and foot tapping were bradykinetic throughout. He was unable to arise from a chair unassisted with arms crossed. Gait was not tested due to severe freezing. There was freezing of gait on initiation, on turning or/and at destination. On pull test he fell on the examiner. He had severe body bradykinesia.        Assessment and Plan:  In summary, this is a 70 year old right-handed gentleman with PD and ET.     1. Cognitive decline: I will start him on rivastigmine. This will be done gradually once his PD medications have been adjusted.     2. Excessive daytime sleepiness/hallucination: Move quetiapine to bedtime and do not administer during the day.     3. Freezing of gait/Parkinsonism: We need to see if this is dopamine responsive. I will increase the dose of Rytary to 3 capsules 4 times a day.     4. Tremor: This may be partly due to ET. Since this is not bothersome, I have not recommended any treatment.     5. He will also benefit with aggressive physical therapy and balance training.     The patient was provided with our clinic numbers if there is any question or concern. A follow-up visit would be arranged in 4 weeks.       Charlies Silvers, MD  Department of Neurology  [place] [facility]      I spent around 50 minutes with the patient. More than half of the time was spent in discussion about his gait, monitoring for improvement with Rytary, and looking for fluctuations.

## 2017-03-02 ENCOUNTER — Ambulatory Visit: Admit: 2017-03-02 | Discharge: 2017-03-03 | Payer: MEDICARE

## 2017-03-02 DIAGNOSIS — I480 Paroxysmal atrial fibrillation: Principal | ICD-10-CM

## 2017-03-02 DIAGNOSIS — I10 Essential (primary) hypertension: ICD-10-CM

## 2017-03-02 NOTE — Progress Notes
Your health and safety are our highest priorities. In the office today, you were noted to have a fall occur in the last 6 months. We want to do whatever we can to prevent this from happening again in the future. Please be sure to use a walker or cane, if these were ordered for you, whenever you walk around at home and outside of your home.     I have included a copy of "What You Can Do To Prevent Falls" brochure. Please review this brochure and add as many recommendations as you can to your daily life. To protect you when you see us in the office, you should be accompanied by a staff member while walking throughout in our office. Please let us know if you have any questions or issues about this time by calling us at [phone].

## 2017-03-02 NOTE — Progress Notes
Date of Service: 03/02/2017    Alejandro Mitchell is a 71 y.o. male.       HPI     Alejandro Mitchell and his wife were in the [place] office today for follow-up regarding his history of paroxysmal atrial fibrillation.  He has quite dramatically progressive Parkinson's with significant cognitive dysfunction, such that he is really no longer able to provide an accurate history.  His wife accompanies him and she does not think he has had any recurrence of atrial arrhythmias.  He does not seem to complain of breathlessness or chest pain and he has not had any new focal neurologic deficits.    His appetite seems to be fairly good and he sleeps quite a bit.         Vitals:    03/02/17 0958   BP: 124/78   Pulse: 75   Weight: 100.2 kg (221 lb)   Height: 1.829 m (6')     Body mass index is 29.97 kg/m???.     Past Medical History  Patient Active Problem List    Diagnosis Date Noted   ??? Visual hallucinations 12/28/2016   ??? 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 AKI (acute kidney injury) (HCC) 12/07/2016   ??? Acute encephalopathy 12/07/2016   ??? Obesity (BMI 35.0-39.9 without comorbidity) 11/28/2013   ??? Pulmonary embolism (HCC) 01/24/2013     09/2012 - Incidental finding on CT-chest done for follow-up after atrial fib ablation.  Warfarin started.     ??? Parkinson's disease (HCC) 01/10/2013     2015 diagnosis, Sinemet started     ??? Hypertension 02/20/2009   ??? Atrial fibrillation (HCC) 02/20/2009     afib ablation done 06/01/10    Intracardiac ablation of atrial fibrillation through pulmonary vein antral isolation in the left atrium  Comprehensive EP study with CS catheter placement & programmed stimulation with and without isuprel    12/13 2012, for Atach/AFib. He had some reconnections around the pulmonary veins that were successfully ablated and there was a right superior pulmonary vein tachycardia that was also ablated. We did a cavotricuspid isthmus ablation as well    09/03/13 Tikosyn Initiated 09/03/13 Echo: Left ventricular systolic function is within normal limits.   LVEF 60%   Mild concentric left ventricular hypertrophy.  Mild left atrial enlargement.   No significant valvular abnormalities.   Normal diastolic function.   LA size 4.0 cm     ??? Obstructive sleep apnea 02/20/2009      Sleep study 4/10 @ [facility]           Review of Systems   Constitution: Positive for weight loss.   HENT: Negative.    Eyes: Negative.    Cardiovascular: Negative.    Respiratory: Negative.    Endocrine: Negative.    Hematologic/Lymphatic: Negative.    Skin: Negative.    Musculoskeletal: Negative.    Gastrointestinal: Positive for diarrhea.   Genitourinary: Negative.    Neurological: Negative.    Psychiatric/Behavioral: Negative.    Allergic/Immunologic: Negative.        Physical Exam    Physical Exam   General Appearance: no distress   Skin: warm, no ulcers or xanthomas   Digits and Nails: no cyanosis or clubbing   Eyes: conjunctivae and lids normal, pupils are equal and round   Teeth/Gums/Palate: dentition unremarkable, no lesions   Lips & Oral Mucosa: no pallor or cyanosis   Neck Veins: normal JVP , neck veins are not distended   Thyroid: no nodules,  masses, tenderness or enlargement   Chest Inspection: chest is normal in appearance   Respiratory Effort: breathing comfortably, no respiratory distress   Auscultation/Percussion: lungs clear to auscultation, no rales or rhonchi, no wheezing   PMI: PMI not enlarged or displaced   Cardiac Rhythm: regular rhythm and normal rate   Cardiac Auscultation: S1, S2 normal, no rub, no gallop   Murmurs: no murmur   Peripheral Circulation: normal peripheral circulation   Carotid Arteries: normal carotid upstroke bilaterally, no bruits   Radial Arteries: normal symmetric radial pulses   Abdominal Aorta: no abdominal aortic bruit   Pedal Pulses: normal symmetric pedal pulses   Lower Extremity Edema: no lower extremity edema Abdominal Exam: soft, non-tender, no masses, bowel sounds normal   Liver & Spleen: no organomegaly       Cardiovascular Studies    EKG:  Sinus rhythm, rate 66.  QTC 470 msec.    Problems Addressed Today  Encounter Diagnoses   Name Primary?   ??? Paroxysmal atrial fibrillation (HCC) Yes   ??? Essential hypertension        Assessment and Plan       Atrial fibrillation (HCC)  Today's EKG shows sinus rhythm with a normal QT interval.  I do not think he has had problems with recurrent atrial arrhythmias while taking Tikosyn.    Hypertension  His blood pressure seems pretty optimal on the current medical program.      Current Medications (including today's revisions)  ??? acetaminophen (TYLENOL) 325 mg tablet Take 650 mg by mouth every 4 hours as needed for Pain.   ??? carbidopa-levodopa ER (RYTARY) 48.75-195 mg Take four capsules by mouth four times daily.   ??? carbidopa/levodopa (SINEMET) 25/100 mg tablet Take one tablet by mouth four times daily.   ??? carvedilol (COREG) 12.5 mg tablet Take 1 tablet by mouth twice daily. Take with food.   ??? dofetilide (TIKOSYN) 500 mcg capsule Take 1 capsule by mouth twice daily.   ??? lidocaine (XYLOCAINE) 2 % topical gel Apply to ingrown toenail as needed.   ??? loperamide(+) (IMODIUM A-D) 2 mg tablet Take 2 mg by mouth as Needed for Diarrhea. Take 2 tablets by mouth initially, followed by 1 tablet by mouth after each loose stool for up to a maximum of 8 tablets in 24 hours.   ??? melatonin 5 mg tab Take 2 tablets by mouth at bedtime daily.   ??? potassium chloride SR (K-DUR) 20 mEq tablet Take 1 tablet by mouth daily with breakfast. Take with a meal and a full glass of water.   ??? psyllium husk (METAMUCIL PO) Take  by mouth.   ??? QUEtiapine (SEROQUEL) 50 mg tablet Take 1 tablet by mouth at bedtime daily.   ??? rivastigmine tartrate(+) (EXELON) 1.5 mg capsule Take one capsule by mouth twice daily.   ??? traMADol (ULTRAM) 50 mg tablet Take 50-100 mg by mouth every 6 hours as needed. ??? traZODone (DESYREL) 50 mg tablet Take 50 mg by mouth at bedtime as needed.   ??? vitamins, multi w/minerals tablet Take 1 tablet by mouth daily.

## 2017-03-07 ENCOUNTER — Encounter: Admit: 2017-03-07 | Discharge: 2017-03-07 | Payer: MEDICARE

## 2017-03-07 NOTE — Telephone Encounter
Do you still have a copy of your instructions that you were given at checkout? Yes    What questions do you have regarding those instructions? Issues with pharmacy stating the insurance will not fill the quetiapine. Informed pt's wife to make sure [facility] is running the updated/changed prescription. If the pharmacy continues to stated there is a problem running it through the insurance please call the clinic and we can assist. Advised pt's wife to please not wait till the last dose to do this. Verbalized understanding and stated the Rytary Rx went through just fine.    When did you start taking new medication prescribed at appointment? Rytary was increased and pt's wife has not seen too much of a difference, but the nursing staff has been documenting the pt's progress. Advised pt's wife to please keep the clinic update so adjustments can be made if appropriate. Verbalized understanding. Quetiapine admin time change was an issue with the nursing facility [facility] because they continued to give the pt the twice daily dosing. She stated this has been resolved. Advised to let us know if they are still having any issues.     What can I help you understand better? See above.

## 2017-03-20 ENCOUNTER — Encounter: Admit: 2017-03-20 | Discharge: 2017-03-20 | Payer: MEDICARE

## 2017-03-20 DIAGNOSIS — I10 Essential (primary) hypertension: Principal | ICD-10-CM

## 2017-03-20 DIAGNOSIS — I2699 Other pulmonary embolism without acute cor pulmonale: ICD-10-CM

## 2017-03-20 DIAGNOSIS — G20 Parkinson's disease: ICD-10-CM

## 2017-03-20 DIAGNOSIS — I4891 Unspecified atrial fibrillation: ICD-10-CM

## 2017-03-20 DIAGNOSIS — G250 Essential tremor: ICD-10-CM

## 2017-03-20 DIAGNOSIS — G4733 Obstructive sleep apnea (adult) (pediatric): ICD-10-CM

## 2017-03-20 DIAGNOSIS — F0390 Unspecified dementia without behavioral disturbance: ICD-10-CM

## 2017-03-20 MED ORDER — CARBIDOPA-LEVODOPA 25-100 MG PO TAB
1 | ORAL_TABLET | Freq: Four times a day (QID) | ORAL | 0 refills | Status: AC
Start: 2017-03-20 — End: 2017-08-17

## 2017-03-20 MED ORDER — RIVASTIGMINE TARTRATE 1.5 MG PO CAP
1.5 mg | ORAL_CAPSULE | Freq: Two times a day (BID) | ORAL | 5 refills | 90.00000 days | Status: AC
Start: 2017-03-20 — End: 2017-05-08

## 2017-03-20 MED ORDER — CARBIDOPA-LEVODOPA 48.75-195 MG PO CPER
4 | ORAL_CAPSULE | Freq: Four times a day (QID) | ORAL | 3 refills | Status: AC
Start: 2017-03-20 — End: ?

## 2017-03-20 NOTE — Progress Notes
Date of Service: 03/20/2017    Subjective:             Alejandro Mitchell is a 71 y.o. male seen in the clinic for parkinsonism.     History of Present Illness  This is a 71 year old right-handed gentleman who started to have slow walking in 2011. 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 Sinemet helped initially for parkinsonism but then stopped helping. He was switched to Rytary for unclear reason. Currently, he is living in a nursing home and his wife visits him 5 times a week. Since increasing the dose of Rytary there has been a slight improvement in his walking but is not a drastic change. His visual hallucinations have improved but he continues to have vivid dreams.     Time spent with dyskinesia: less than 25% of the waking day  Functional impact of dyskinesia: slight  Time spent in OFF state: 50%  Functional impact of fluctuations: moderate  Complexity of fluctuations: mild  Painful OFF state dystonia: none       Review of Systems   Respiratory: Positive for apnea.    Genitourinary: Positive for urgency.   Neurological: Positive for tremors and speech difficulty.   Psychiatric/Behavioral: Positive for confusion, decreased concentration and sleep disturbance.   All other systems reviewed and are negative.    Allergies   Allergen Reactions   ??? Adhesive RASH     From EKG electrodes when left on a long time.    ??? Sulfa (Sulfonamide Antibiotics) RASH       Objective:         ??? acetaminophen (TYLENOL) 325 mg tablet Take 650 mg by mouth every 4 hours as needed for Pain.   ??? carbidopa-levodopa ER (RYTARY) 48.75-195 mg Take four capsules by mouth four times daily.   ??? carbidopa/levodopa (SINEMET) 25/100 mg tablet Take one tablet by mouth four times daily.   ??? carvedilol (COREG) 12.5 mg tablet Take 1 tablet by mouth twice daily. Take with food.   ??? dofetilide (TIKOSYN) 500 mcg capsule Take 1 capsule by mouth twice daily.   ??? lidocaine (XYLOCAINE) 2 % topical gel Apply to ingrown toenail as needed. ??? loperamide(+) (IMODIUM A-D) 2 mg tablet Take 2 mg by mouth as Needed for Diarrhea. Take 2 tablets by mouth initially, followed by 1 tablet by mouth after each loose stool for up to a maximum of 8 tablets in 24 hours.   ??? melatonin 5 mg tab Take 2 tablets by mouth at bedtime daily.   ??? potassium chloride SR (K-DUR) 20 mEq tablet Take 1 tablet by mouth daily with breakfast. Take with a meal and a full glass of water.   ??? psyllium husk (METAMUCIL PO) Take  by mouth.   ??? QUEtiapine (SEROQUEL) 50 mg tablet Take 1 tablet by mouth at bedtime daily.   ??? rivastigmine tartrate(+) (EXELON) 1.5 mg capsule Take one capsule by mouth twice daily.   ??? traMADol (ULTRAM) 50 mg tablet Take 50-100 mg by mouth every 6 hours as needed.   ??? traZODone (DESYREL) 50 mg tablet Take 50 mg by mouth at bedtime as needed.   ??? vitamins, multi w/minerals tablet Take 1 tablet by mouth daily.     Vitals:    03/20/17 1239   BP: 150/73   Pulse: 63   SpO2: 97%   Weight: (P) 100.2 kg (221 lb)   Height: (P) 182.9 cm (72.01)     Body mass index  is 29.97 kg/m??? (pended).     Physical Exam  hypophonic speech and mildly decreased facial expressions. There was palmomental reflex, snout, and Myerson's. There was no tremor at rest in the bilateral upper extremities. There was mild symmetric action, postural, and intention tremor in the bilateral upper extremities. Tone was increased in the legs. Finger tapping, hand grips, rapid alternating movements and foot tapping were bradykinetic throughout. He was unable to arise from a chair unassisted with arms crossed. Gait was not tested due to severe freezing. There was freezing of gait on initiation, on turning or/and at destination. On pull test he fell on the examiner. He had severe body bradykinesia.        Assessment and Plan:  In summary, this is a 71 year old right-handed gentleman with PD, ET, and dementia.   ???  1. Cognitive decline: I have started him on rivastigmine 1.5mg  twice a day. The side effects of bradycardia and diarrhea was explained.   ???  2. Excessive daytime sleepiness/hallucination: Quetiapine at bedtime and rivastigmine should help some. I might consider adding a stimulant during the day.   ???  3. Freezing of gait/Parkinsonism: Increase Rytary to 4 capsules 4 times a day. I will perform OFF and ON testing at the time of next clinic visit.   ???  4. Tremor: This is partly due to ET. Since this is not bothersome, I have not recommended any treatment.   ???  5. Continue aggressive PT, OT, and speech therapy.     6. Urinary dysfunction: This is very intermittent and is not bothersome at this point.     ???  The patient was provided with our clinic numbers if there is any question or concern. A follow-up visit would be arranged in 8 weeks.   ???  ???  Charlies Silvers, MD  Department of Neurology  [facility]      I spent 30 minutes with the patient. More than 50% of the time was spent in discussion about responsiveness to Rytary, management of dementia, and fall precautions.

## 2017-03-21 ENCOUNTER — Ambulatory Visit: Admit: 2017-03-20 | Discharge: 2017-03-21 | Payer: MEDICARE

## 2017-03-21 DIAGNOSIS — F0390 Unspecified dementia without behavioral disturbance: ICD-10-CM

## 2017-03-30 ENCOUNTER — Ambulatory Visit: Admit: 2017-03-30 | Discharge: 2017-03-31 | Payer: MEDICARE

## 2017-03-30 DIAGNOSIS — F4321 Adjustment disorder with depressed mood: ICD-10-CM

## 2017-03-30 DIAGNOSIS — R4189 Other symptoms and signs involving cognitive functions and awareness: Principal | ICD-10-CM

## 2017-03-30 NOTE — Progress Notes
Neuropsychological Evaluation Feedback:  1350-1450  Pt seen with his wife also present for feedback regarding neuropsychological evaluation done on 02/09/17.  Pt was lethargic and could not participate in the evaluation.  They arrived 47 mins early for the appt and the pt was carsick and vomitted in the waiting room.  He was brought into a pt care room from the waiting room and was quickly able to stop vomiting, but he was then significantly tired and confused, and unable to project his voice, talking in a whisper when he attempted to talk which was rarely.  He mostly was sleeping throughout the session.  His wife said they went the ED instead of his family doctor after his neuropsychological eval appt on 7/5 and they were told he did not have a UTI.  He was sent to a nursing home from the hospital and has been there since then.  His wife acknowledged she can no longer care for him physically at home.  His hallucinations have reportedly decreased in frequency.  His wife was again confused about my discipline and role and thought that I would be following him as his psychiatrist.  I again explained to her my role and said I cannot prescribe medications but they need to follow-up with Dr. Marlyce Huge for this (she cancelled his previously scheduled appointment and had not rescheduled because she thought I could prescribe pt's medicines).  She is clear now that he needs to follow with Dr. Chales Abrahams, Dr Marlyce Huge, and his PCP.      They were provided with a copy of the report and asked appropriate questions.  She verbalized understanding of the information.  She said that she hopes that he can get better and come home from the nursing home but she seems realistic that he may not.  She said they were told previously he does not have Lewy Body because it didn't show up on a brain scan.  She also questioned whether he would need to see a psychiatrist and I emphasized it would be a good idea, both regarding his hallucinations and his depressed mood symptoms.  She appeared quite tired and worn out to me.  I recommended the 36 Hour Day and that she continue to ask questions so that she understands his care and prognosis.    All of her questions were answered.  No follow-up is needed in this clinic.  I do not think he will be able to participate appropriately in a neuropsychological evaluation.      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

## 2017-03-31 ENCOUNTER — Encounter: Admit: 2017-03-31 | Discharge: 2017-03-31 | Payer: MEDICARE

## 2017-03-31 NOTE — Telephone Encounter
Wife, [place], just called stating you had referred Alejandro Mitchell to see Dr. Purvis Sheffield yet his office is requiring a faxed referral before scheduling.  FAX# [phone]

## 2017-04-05 NOTE — Assessment & Plan Note
Today's EKG shows sinus rhythm with a normal QT interval.  I do not think he has had problems with recurrent atrial arrhythmias while taking Tikosyn.

## 2017-04-05 NOTE — Assessment & Plan Note
His blood pressure seems pretty optimal on the current medical program.

## 2017-04-24 LAB — COMPREHENSIVE METABOLIC PANEL
Lab: 122
Lab: 138 — ABNORMAL LOW (ref 4.70–6.10)
Lab: 16 — ABNORMAL HIGH (ref 0–14)
Lab: 16 — ABNORMAL HIGH (ref 27.0–31.0)
Lab: 23
Lab: <6

## 2017-04-25 LAB — BASIC METABOLIC PANEL
Lab: 0.7
Lab: 139 — ABNORMAL LOW (ref 14.0–18.0)
Lab: 3.9 — ABNORMAL LOW (ref 42.0–52.0)
Lab: 89
Lab: 9.1

## 2017-04-25 LAB — CBC: Lab: 5.8

## 2017-05-08 ENCOUNTER — Ambulatory Visit: Admit: 2017-05-08 | Discharge: 2017-05-09 | Payer: MEDICARE

## 2017-05-08 ENCOUNTER — Encounter: Admit: 2017-05-08 | Discharge: 2017-05-08 | Payer: MEDICARE

## 2017-05-08 DIAGNOSIS — F0390 Unspecified dementia without behavioral disturbance: ICD-10-CM

## 2017-05-08 DIAGNOSIS — I10 Essential (primary) hypertension: Principal | ICD-10-CM

## 2017-05-08 DIAGNOSIS — G250 Essential tremor: ICD-10-CM

## 2017-05-08 DIAGNOSIS — G4733 Obstructive sleep apnea (adult) (pediatric): ICD-10-CM

## 2017-05-08 DIAGNOSIS — G20 Parkinson's disease: ICD-10-CM

## 2017-05-08 DIAGNOSIS — I4891 Unspecified atrial fibrillation: ICD-10-CM

## 2017-05-08 DIAGNOSIS — I2699 Other pulmonary embolism without acute cor pulmonale: ICD-10-CM

## 2017-05-08 MED ORDER — RIVASTIGMINE TARTRATE 3 MG PO CAP
3 mg | ORAL_CAPSULE | Freq: Two times a day (BID) | ORAL | 5 refills | 90.00000 days | Status: AC
Start: 2017-05-08 — End: 2017-06-23

## 2017-05-08 NOTE — Progress Notes
Date of Service: 05/08/2017    Subjective:             Alejandro Mitchell is a 71 y.o. male with parkinsonism and cognitive impairment.     History of Present Illness  This is a 71 year old right-handed gentleman who started to have slow walking in 2011. 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 He is currently on Rytary 4 capsules 4 times a day. He continues to have visual hallucinations which is not bothersome. He does not sleep well at night because of unclear reason and as a result feels sleepy during the day. He has memory distortion where he thinks that his mother visited him and his grand child died. He is dependent on basic ADLs due to memory and motor impairment. He walks with a walker in the presence of CNA at the NH.        Review of Systems   Musculoskeletal: Positive for gait problem.   All other systems reviewed and are negative.    Objective:         ??? acetaminophen (TYLENOL) 325 mg tablet Take 650 mg by mouth every 4 hours as needed for Pain.   ??? carbidopa-levodopa ER (RYTARY) 48.75-195 mg Take four capsules by mouth four times daily.   ??? carbidopa/levodopa (SINEMET) 25/100 mg tablet Take one tablet by mouth four times daily.   ??? carvedilol (COREG) 12.5 mg tablet Take 1 tablet by mouth twice daily. Take with food.   ??? dofetilide (TIKOSYN) 500 mcg capsule Take 1 capsule by mouth twice daily.   ??? lidocaine (XYLOCAINE) 2 % topical gel Apply to ingrown toenail as needed.   ??? loperamide(+) (IMODIUM A-D) 2 mg tablet Take 2 mg by mouth as Needed for Diarrhea. Take 2 tablets by mouth initially, followed by 1 tablet by mouth after each loose stool for up to a maximum of 8 tablets in 24 hours.   ??? melatonin 5 mg tab Take 2 tablets by mouth at bedtime daily.   ??? potassium chloride SR (K-DUR) 20 mEq tablet Take 1 tablet by mouth daily with breakfast. Take with a meal and a full glass of water.   ??? psyllium husk (METAMUCIL PO) Take  by mouth. ??? QUEtiapine (SEROQUEL) 50 mg tablet Take 1 tablet by mouth at bedtime daily.   ??? rivastigmine tartrate(+) (EXELON) 3 mg capsule Take one capsule by mouth twice daily.   ??? traMADol (ULTRAM) 50 mg tablet Take 50-100 mg by mouth every 6 hours as needed.   ??? traZODone (DESYREL) 50 mg tablet Take 50 mg by mouth at bedtime as needed.   ??? vitamins, multi w/minerals tablet Take 1 tablet by mouth daily.     Vitals:    05/08/17 1614   BP: 163/70   Pulse: 61   SpO2: 98%   Weight: 91.6 kg (202 lb)   Height: 182.9 cm (72.01)     Body mass index is 27.39 kg/m???.     Physical Exam  hypophonic speech and mildly decreased facial expressions. There was palmomental reflex, snout, and Myerson's. There was no tremor at rest in the bilateral upper extremities. There was mild symmetric action, postural, and intention???tremor in the bilateral upper extremities. Tone was increased in the legs. Finger tapping, hand grips, rapid alternating movements and foot tapping were bradykinetic throughout. He was unable to arise from a chair unassisted with arms crossed. Gait was not tested due to severe freezing. He had severe???body bradykinesia.   ???  Assessment and Plan:  In summary, this is a 71 year old right-handed gentleman with PD, ET, and dementia.   ???  1. Cognitive decline: He meets the diagnostic criteria for dementia as there is a clear impairment in iADLs. I have increased the dose of rivastigmine to 3mg  twice a day.   ???  2. Excessive daytime sleepiness/hallucination: Quetiapine at night time only.   ???  3. Freezing of gait/Parkinsonism: Rytary is helping some but there is a concern of excessive daytime sleepiness and worsening of hallucinations if I increase the dose further.   ???  4. Tremor: This is partly due to ET. Since this is not bothersome, I have not recommended any treatment.   ???  5. Continue aggressive PT, OT, and speech therapy.   ???  6. Urinary dysfunction: This is very intermittent and is not bothersome at this point.   ???  ??? The patient was provided with our clinic numbers if there is any question or concern. A follow-up visit would be arranged in 12 weeks.   ???  ???  Charlies Silvers, MD  Department of Neurology  [facility]  ???  ???  I spent 30 minutes with the patient. More than 50% of the time was spent in discussion about the differentiation between Parkinson disease dementia and Lewy Body dementia. I also discussed about some problem associated with an increase in the dose of L-dopa in the presence of dementia.    ???

## 2017-05-09 DIAGNOSIS — F0281 Dementia in other diseases classified elsewhere with behavioral disturbance: ICD-10-CM

## 2017-06-22 ENCOUNTER — Encounter: Admit: 2017-06-22 | Discharge: 2017-06-22 | Payer: MEDICARE

## 2017-06-22 DIAGNOSIS — G20 Parkinson's disease: ICD-10-CM

## 2017-06-22 NOTE — Telephone Encounter
Patient's wife states that the patient has been vomiting daily for the last 2 weeks and she feels that this caused by the rivastigmine.

## 2017-06-23 MED ORDER — RIVASTIGMINE TARTRATE 1.5 MG PO CAP
1.5 mg | ORAL_CAPSULE | Freq: Two times a day (BID) | ORAL | 0 refills | 90.00000 days | Status: AC
Start: 2017-06-23 — End: 2017-08-09

## 2017-06-23 NOTE — Telephone Encounter
Per Dr. Chales AbrahamsGupta, patient is to decrease rivastigmine to 1.5mg  BID. Notified patient's wife, wife voiced understanding.

## 2017-07-04 ENCOUNTER — Encounter: Admit: 2017-07-04 | Discharge: 2017-07-04 | Payer: MEDICARE

## 2017-07-04 DIAGNOSIS — G20 Parkinson's disease: Principal | ICD-10-CM

## 2017-07-04 MED ORDER — QUETIAPINE 50 MG PO TAB
ORAL_TABLET | Freq: Every day | 3 refills | Status: AC
Start: 2017-07-04 — End: ?

## 2017-08-02 ENCOUNTER — Encounter: Admit: 2017-08-02 | Discharge: 2017-08-02 | Payer: MEDICARE

## 2017-08-02 MED ORDER — DOFETILIDE 500 MCG PO CAP
500 ug | ORAL_CAPSULE | Freq: Two times a day (BID) | ORAL | 0 refills | Status: AC
Start: 2017-08-02 — End: ?

## 2017-08-02 NOTE — Telephone Encounter
Pt due to see Dr. Barry Dieneswens in January, accepted OV for 1/10 at our [place] location.  Script filled as requested.

## 2017-08-07 ENCOUNTER — Encounter: Admit: 2017-08-07 | Discharge: 2017-08-07 | Payer: MEDICARE

## 2017-08-08 ENCOUNTER — Encounter: Admit: 2017-08-08 | Discharge: 2017-08-08 | Payer: MEDICARE

## 2017-08-08 DIAGNOSIS — G20 Parkinson's disease: Principal | ICD-10-CM

## 2017-08-09 MED ORDER — RIVASTIGMINE TARTRATE 1.5 MG PO CAP
ORAL_CAPSULE | Freq: Two times a day (BID) | ORAL | 0 refills | 90.00000 days | Status: AC
Start: 2017-08-09 — End: 2017-08-24

## 2017-08-13 ENCOUNTER — Encounter: Admit: 2017-08-13 | Discharge: 2017-08-13 | Payer: MEDICARE

## 2017-08-13 DIAGNOSIS — G20 Parkinson's disease: Principal | ICD-10-CM

## 2017-08-14 MED ORDER — RIVASTIGMINE TARTRATE 1.5 MG PO CAP
ORAL_CAPSULE | Freq: Two times a day (BID) | 0 refills
Start: 2017-08-14 — End: ?

## 2017-08-17 ENCOUNTER — Ambulatory Visit: Admit: 2017-08-17 | Discharge: 2017-08-18 | Payer: MEDICARE

## 2017-08-17 ENCOUNTER — Encounter: Admit: 2017-08-17 | Discharge: 2017-08-17 | Payer: MEDICARE

## 2017-08-17 DIAGNOSIS — G250 Essential tremor: ICD-10-CM

## 2017-08-17 DIAGNOSIS — I480 Paroxysmal atrial fibrillation: Principal | ICD-10-CM

## 2017-08-17 DIAGNOSIS — I10 Essential (primary) hypertension: Principal | ICD-10-CM

## 2017-08-17 DIAGNOSIS — I4891 Unspecified atrial fibrillation: ICD-10-CM

## 2017-08-17 DIAGNOSIS — I2699 Other pulmonary embolism without acute cor pulmonale: ICD-10-CM

## 2017-08-17 DIAGNOSIS — G20 Parkinson's disease: ICD-10-CM

## 2017-08-17 DIAGNOSIS — F0390 Unspecified dementia without behavioral disturbance: ICD-10-CM

## 2017-08-17 DIAGNOSIS — G4733 Obstructive sleep apnea (adult) (pediatric): ICD-10-CM

## 2017-08-24 ENCOUNTER — Ambulatory Visit: Admit: 2017-08-24 | Discharge: 2017-08-25 | Payer: MEDICARE

## 2017-08-24 ENCOUNTER — Encounter: Admit: 2017-08-24 | Discharge: 2017-08-24 | Payer: MEDICARE

## 2017-08-24 DIAGNOSIS — F0390 Unspecified dementia without behavioral disturbance: ICD-10-CM

## 2017-08-24 DIAGNOSIS — G250 Essential tremor: ICD-10-CM

## 2017-08-24 DIAGNOSIS — I2699 Other pulmonary embolism without acute cor pulmonale: ICD-10-CM

## 2017-08-24 DIAGNOSIS — G20 Parkinson's disease: ICD-10-CM

## 2017-08-24 DIAGNOSIS — I10 Essential (primary) hypertension: Principal | ICD-10-CM

## 2017-08-24 DIAGNOSIS — I4891 Unspecified atrial fibrillation: ICD-10-CM

## 2017-08-24 DIAGNOSIS — G4733 Obstructive sleep apnea (adult) (pediatric): ICD-10-CM

## 2017-08-24 MED ORDER — RIVASTIGMINE TARTRATE 3 MG PO CAP
3 mg | ORAL_CAPSULE | Freq: Two times a day (BID) | ORAL | 5 refills | 90.00000 days | Status: AC
Start: 2017-08-24 — End: 2017-08-24

## 2017-08-24 MED ORDER — RIVASTIGMINE TARTRATE 3 MG PO CAP
3 mg | ORAL_CAPSULE | Freq: Two times a day (BID) | ORAL | 5 refills | 90.00000 days | Status: AC
Start: 2017-08-24 — End: ?

## 2017-08-25 DIAGNOSIS — G20 Parkinson's disease: Principal | ICD-10-CM

## 2017-08-25 DIAGNOSIS — F0281 Dementia in other diseases classified elsewhere with behavioral disturbance: ICD-10-CM

## 2017-09-04 ENCOUNTER — Encounter: Admit: 2017-09-04 | Discharge: 2017-09-04 | Payer: MEDICARE

## 2017-09-08 DEATH — deceased

## 2017-10-06 DEATH — deceased

## 2018-01-24 IMAGING — CR LOW_EXM
2 series · 2 of 2 positions shown · non-contrast
Comparison: none

[knee sunrise]
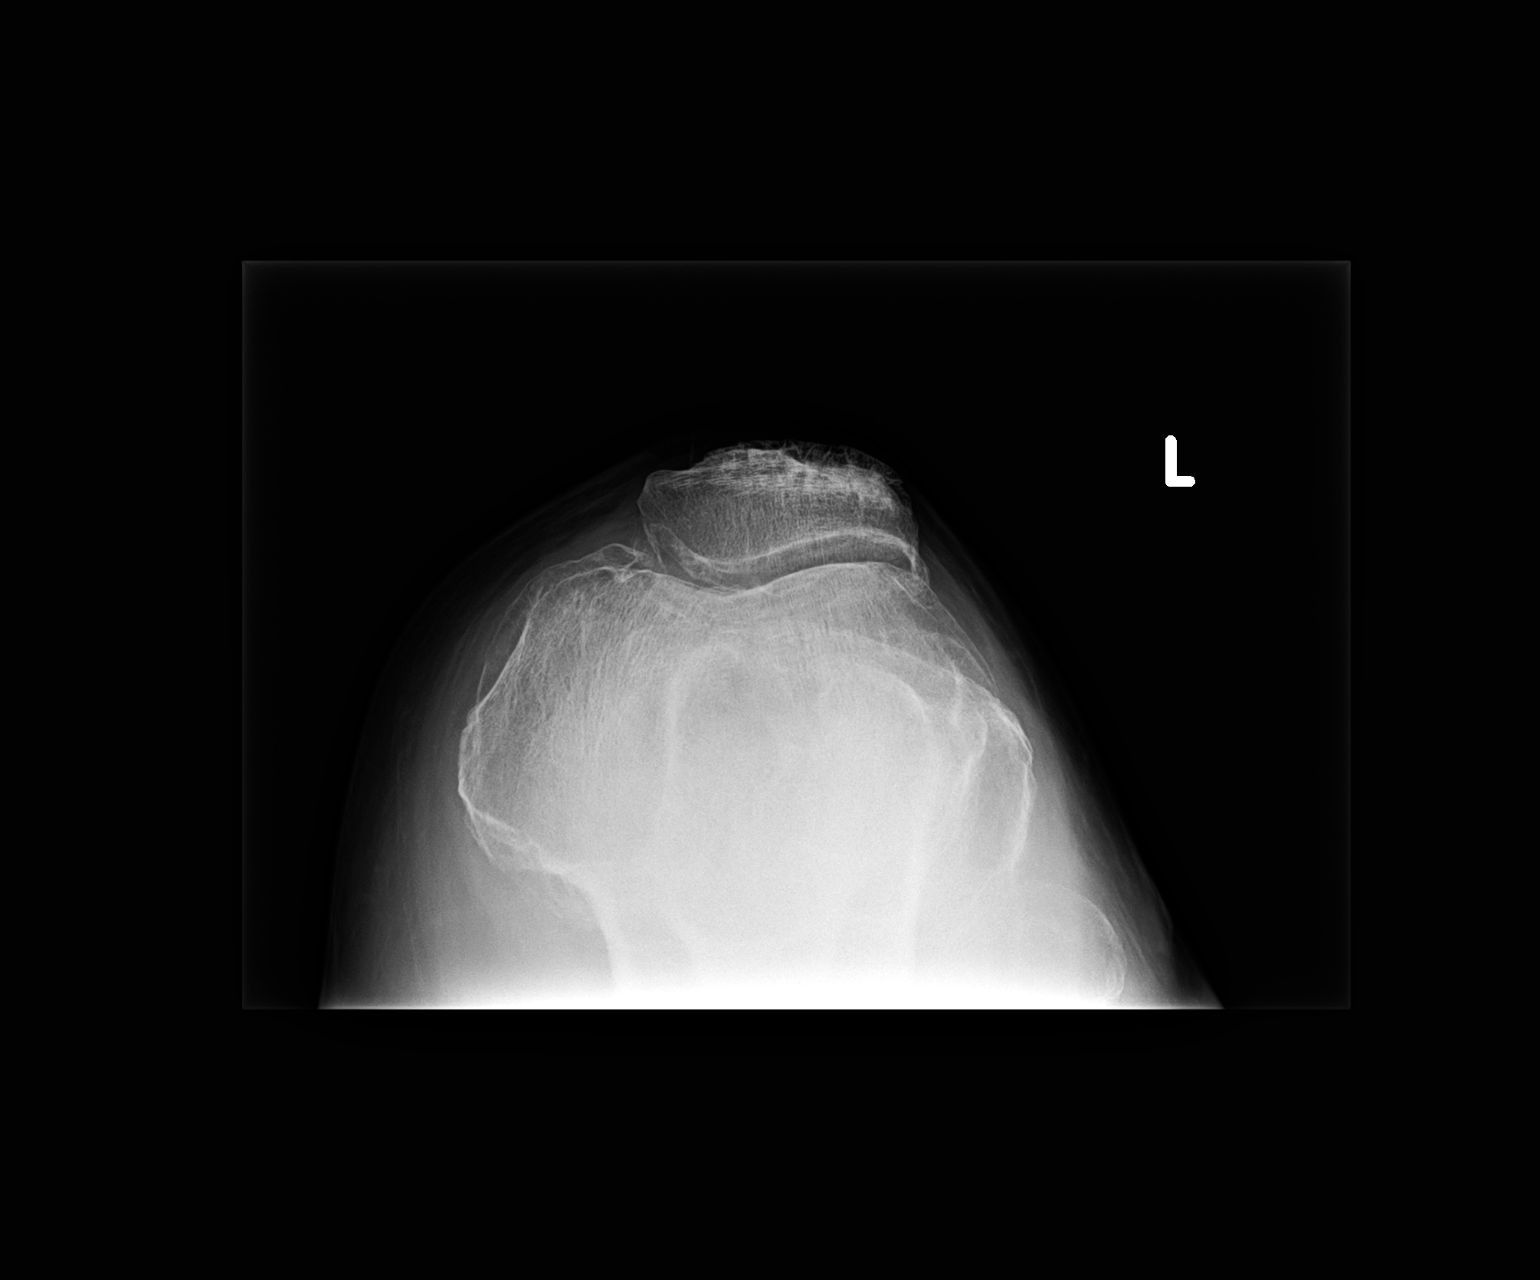

[knee lat]
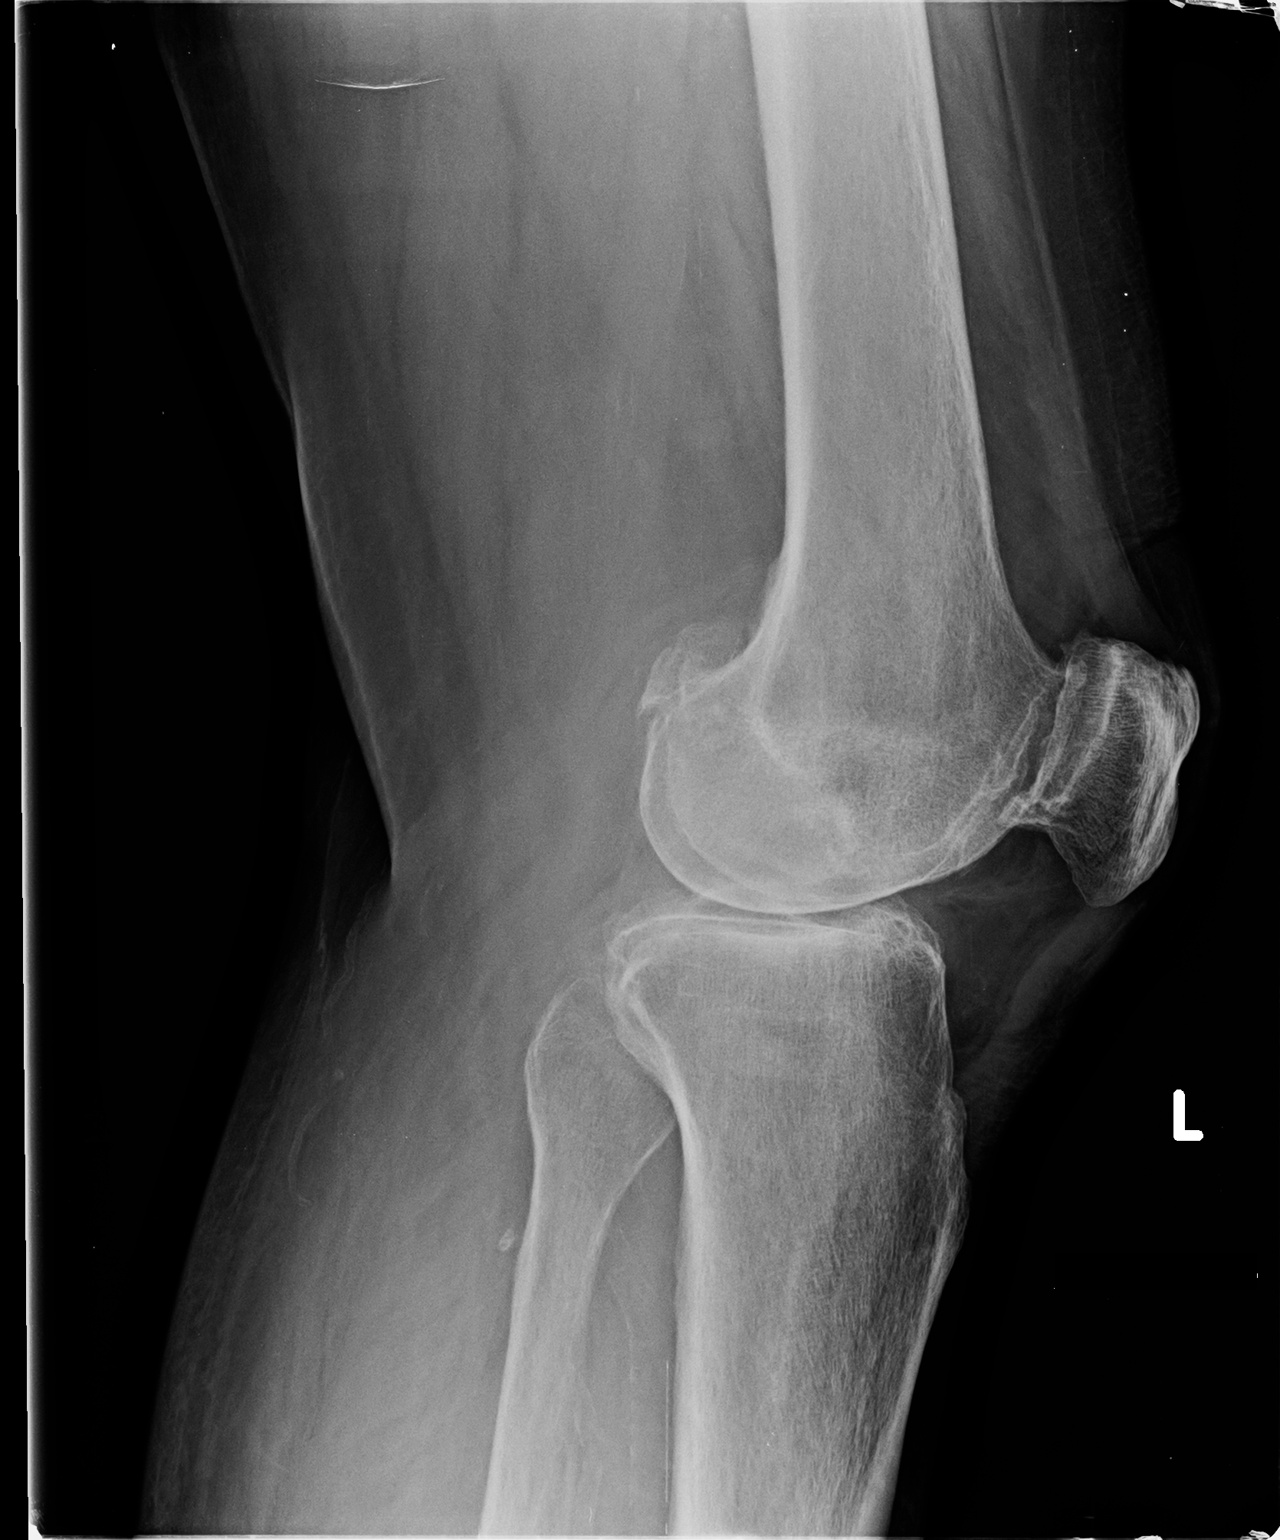

[2 of 2 positions shown; findings below may reference images not displayed]

DIAGNOSTIC STUDIES

EXAM

Left knee.

INDICATION

knee pain and swelling
PT C/O LEFT KNEE PAIN. TJ/MT

TECHNIQUE

AP, sunrise, and lateral left knee views.

COMPARISONS

None.

FINDINGS

There is moderate to severe tricompartmental osteoarthropathy with joint space narrowing most
pronounced in the lateral compartment. No acutely displaced fracture. Bulky marginal osteophytic
spurring, particularly at the lateral and inferior patella. The extensor mechanism of the knee is
grossly intact. No joint effusion. Bone demineralization. Vascular calcifications. Slight lateral
translation of the tibia.

IMPRESSION

Moderate to severe tricompartmental osteoarthropathy.

## 2023-06-18 ENCOUNTER — Encounter: Admit: 2023-06-18 | Discharge: 2023-06-18 | Payer: MEDICARE
